# Patient Record
Sex: Female | Born: 1965 | Race: White | Hispanic: No | Smoking: Current every day smoker
Health system: Southern US, Community
[De-identification: ages and names within clinical notes are randomized; demographics above are authoritative.]

## PROBLEM LIST (undated history)

## (undated) DIAGNOSIS — N6310 Unspecified lump in the right breast, unspecified quadrant: Secondary | ICD-10-CM

## (undated) DIAGNOSIS — T8859XA Other complications of anesthesia, initial encounter: Secondary | ICD-10-CM

## (undated) DIAGNOSIS — M1990 Unspecified osteoarthritis, unspecified site: Secondary | ICD-10-CM

## (undated) DIAGNOSIS — T4145XA Adverse effect of unspecified anesthetic, initial encounter: Secondary | ICD-10-CM

## (undated) HISTORY — PX: ABDOMINAL HYSTERECTOMY: SHX81

## (undated) HISTORY — PX: THYROIDECTOMY, PARTIAL: SHX18

---

## 2010-09-12 ENCOUNTER — Encounter: Admission: RE | Admit: 2010-09-12 | Discharge: 2010-09-12 | Payer: Self-pay | Admitting: Obstetrics and Gynecology

## 2011-08-13 ENCOUNTER — Emergency Department (HOSPITAL_COMMUNITY)
Admission: EM | Admit: 2011-08-13 | Discharge: 2011-08-13 | Discharge status: Against Medical Advice | Payer: Self-pay | Attending: Emergency Medicine | Admitting: Emergency Medicine

## 2011-08-13 DIAGNOSIS — R079 Chest pain, unspecified: Secondary | ICD-10-CM | POA: Insufficient documentation

## 2012-07-26 ENCOUNTER — Ambulatory Visit: Payer: Self-pay

## 2012-12-17 ENCOUNTER — Other Ambulatory Visit: Payer: Self-pay | Admitting: Family Medicine

## 2012-12-17 DIAGNOSIS — Z1231 Encounter for screening mammogram for malignant neoplasm of breast: Secondary | ICD-10-CM

## 2013-01-12 ENCOUNTER — Ambulatory Visit
Admission: RE | Admit: 2013-01-12 | Discharge: 2013-01-12 | Disposition: A | Discharge status: Home / Self Care | Payer: BC Managed Care – PPO | Source: Ambulatory Visit | Attending: Family Medicine | Admitting: Family Medicine

## 2013-01-12 DIAGNOSIS — Z1231 Encounter for screening mammogram for malignant neoplasm of breast: Secondary | ICD-10-CM

## 2013-11-29 IMAGING — CR DG SHOULDER 3+V*R*
1 series · 3 of 3 positions shown · non-contrast
Comparison: none

REASON FOR EXAM: shoulder pain and swelling
COMMENTS:   LMP: Post Hysterectomy

PROCEDURE:     MDR - MDR SHOULDER RIGHT COMPLETE  - July 26, 2012  [DATE]
RESULT:     Comparison: None.

[Series 1: internal rotate · 0.17mm/px · 3 of 3 slices shown]
[im 1/3]
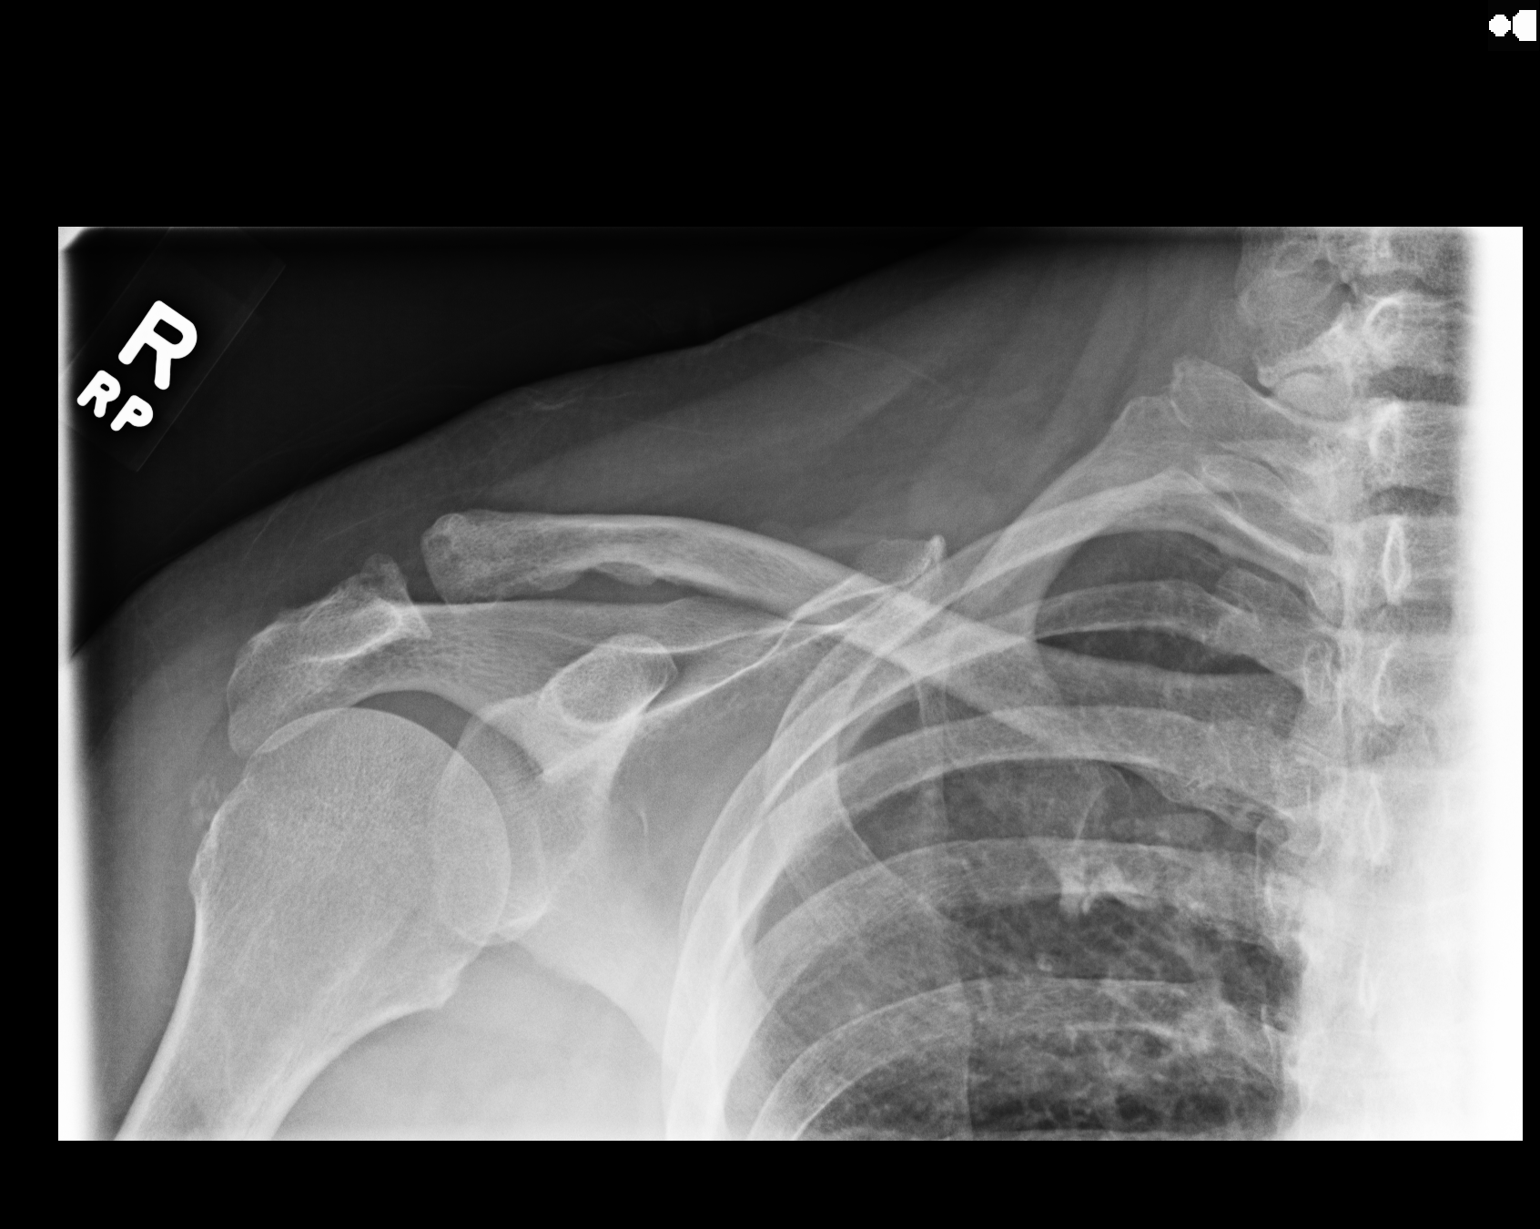
[im 2/3]
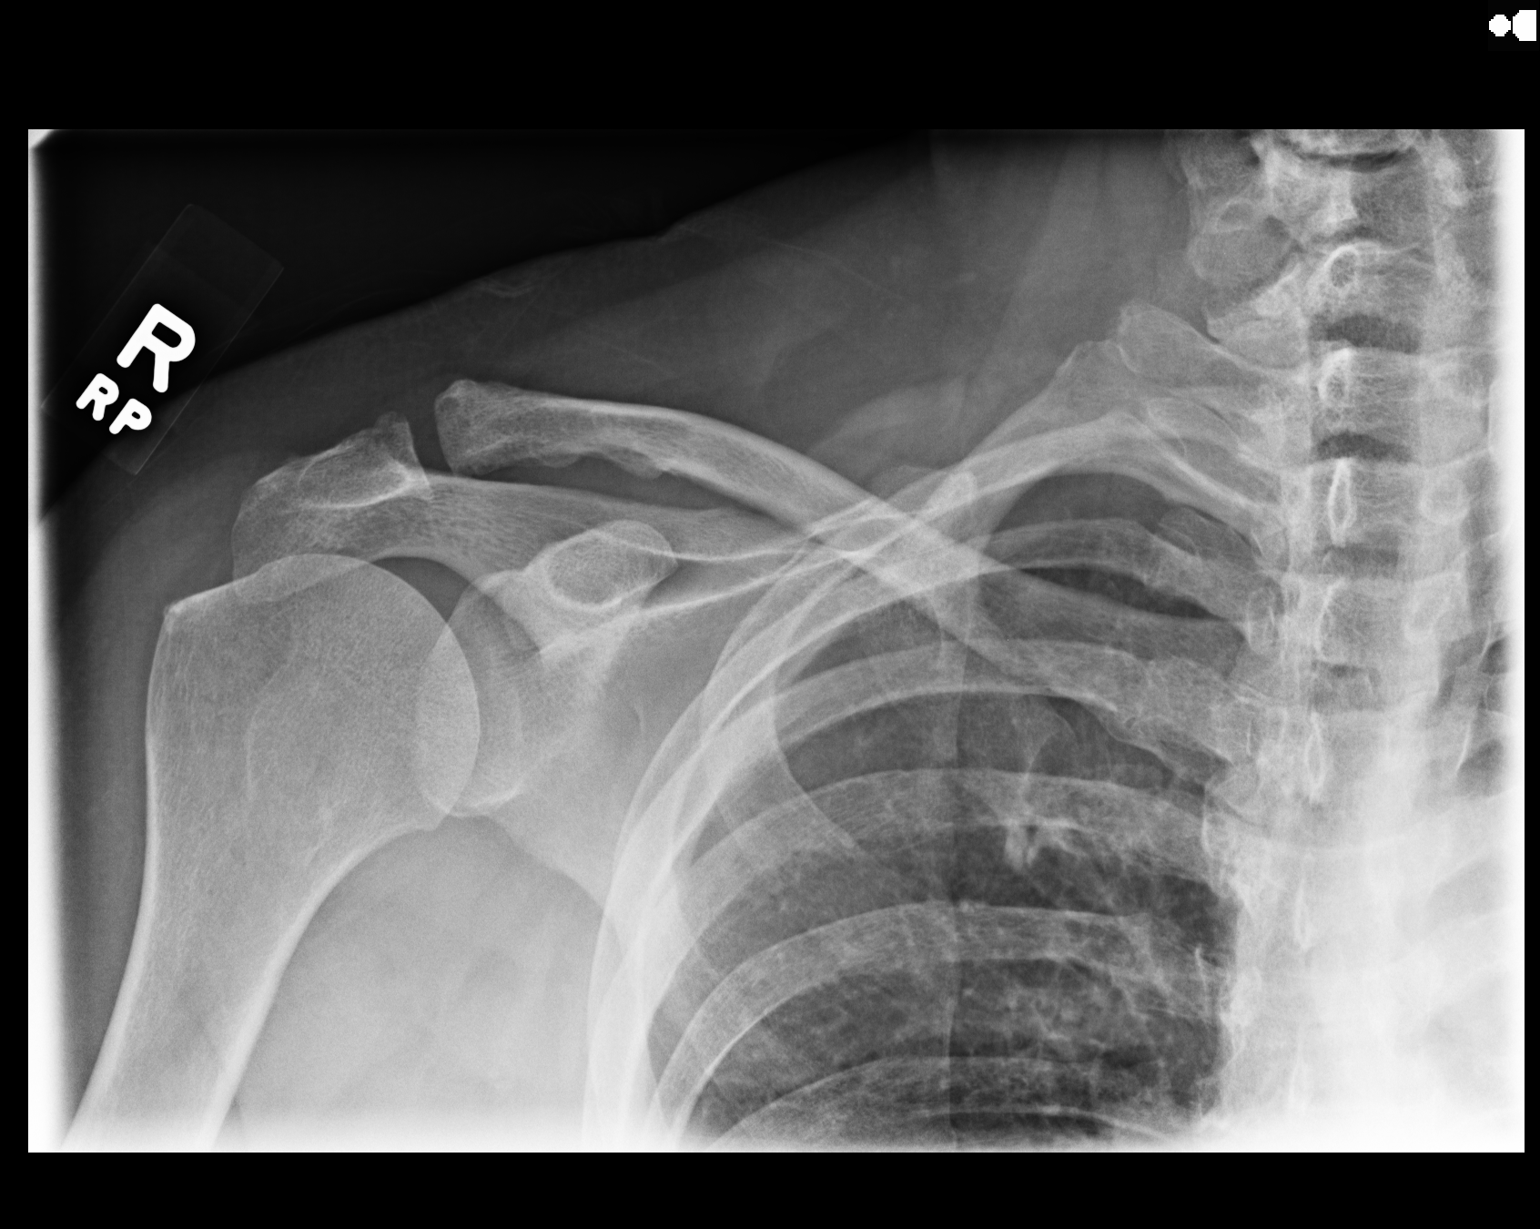
[im 3/3]
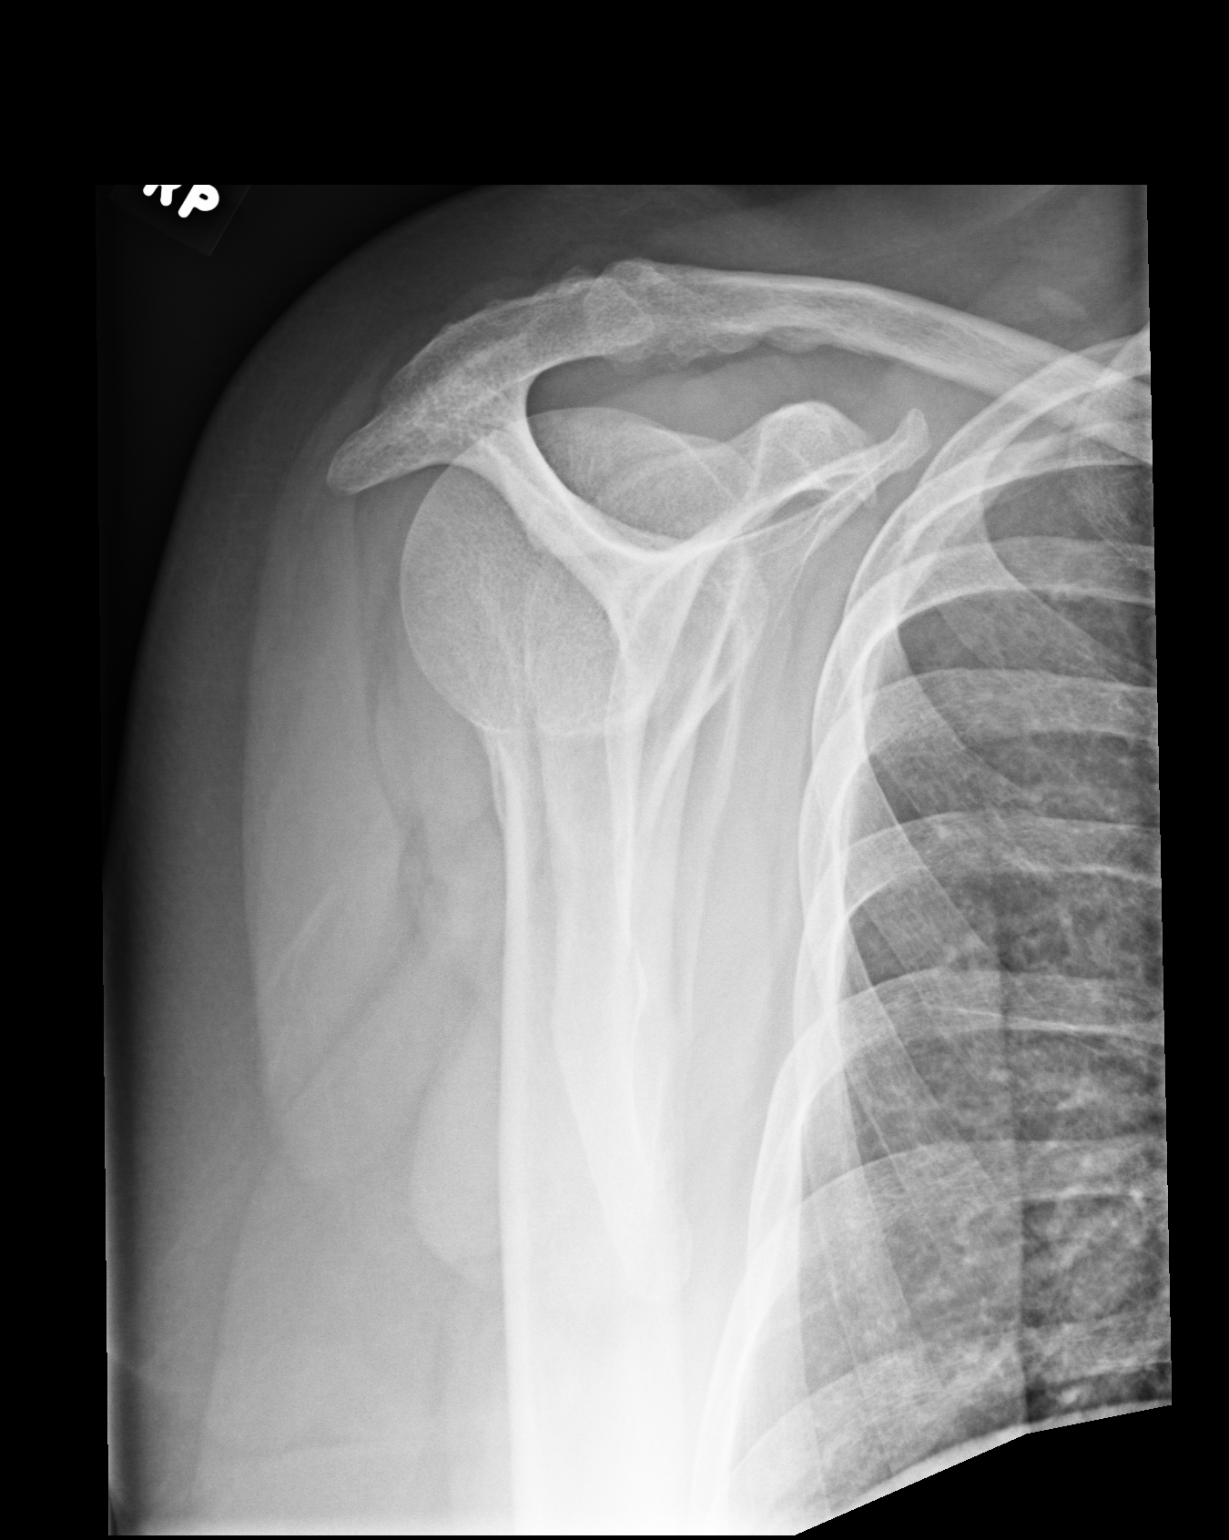

[3 of 3 positions shown; findings below may reference images not displayed]

FINDINGS: There is minimal degenerative change of the acromioclavicular joint. No
acute fracture or dislocation. Small calcific density on the posterior
lateral aspect of humeral head may be related to calcific tendinosis of the
infraspinatus.
IMPRESSION: Findings which raise the possibility of calcific tendinosis.

[REDACTED]

## 2013-12-13 ENCOUNTER — Other Ambulatory Visit: Payer: Self-pay

## 2013-12-13 DIAGNOSIS — Z1231 Encounter for screening mammogram for malignant neoplasm of breast: Secondary | ICD-10-CM

## 2014-01-16 ENCOUNTER — Ambulatory Visit
Admission: RE | Admit: 2014-01-16 | Discharge: 2014-01-16 | Disposition: A | Discharge status: Home / Self Care | Payer: BC Managed Care – PPO | Source: Ambulatory Visit

## 2014-01-16 DIAGNOSIS — Z1231 Encounter for screening mammogram for malignant neoplasm of breast: Secondary | ICD-10-CM

## 2014-01-17 ENCOUNTER — Other Ambulatory Visit: Payer: Self-pay | Admitting: Nurse Practitioner

## 2014-01-17 DIAGNOSIS — R928 Other abnormal and inconclusive findings on diagnostic imaging of breast: Secondary | ICD-10-CM

## 2014-01-30 ENCOUNTER — Ambulatory Visit
Admission: RE | Admit: 2014-01-30 | Discharge: 2014-01-30 | Disposition: A | Discharge status: Home / Self Care | Payer: BC Managed Care – PPO | Source: Ambulatory Visit | Attending: Nurse Practitioner | Admitting: Nurse Practitioner

## 2014-01-30 ENCOUNTER — Other Ambulatory Visit: Payer: Self-pay | Admitting: Nurse Practitioner

## 2014-01-30 DIAGNOSIS — R928 Other abnormal and inconclusive findings on diagnostic imaging of breast: Secondary | ICD-10-CM

## 2014-02-08 ENCOUNTER — Inpatient Hospital Stay: Admission: RE | Admit: 2014-02-08 | Payer: BC Managed Care – PPO | Source: Ambulatory Visit

## 2014-02-14 ENCOUNTER — Ambulatory Visit
Admission: RE | Admit: 2014-02-14 | Discharge: 2014-02-14 | Disposition: A | Discharge status: Home / Self Care | Payer: BC Managed Care – PPO | Source: Ambulatory Visit | Attending: Nurse Practitioner | Admitting: Nurse Practitioner

## 2014-02-14 ENCOUNTER — Other Ambulatory Visit: Payer: Self-pay | Admitting: Nurse Practitioner

## 2014-02-14 DIAGNOSIS — R928 Other abnormal and inconclusive findings on diagnostic imaging of breast: Secondary | ICD-10-CM

## 2014-07-04 ENCOUNTER — Other Ambulatory Visit: Payer: Self-pay | Admitting: Nurse Practitioner

## 2014-07-04 DIAGNOSIS — N6452 Nipple discharge: Secondary | ICD-10-CM

## 2014-07-13 ENCOUNTER — Ambulatory Visit
Admission: RE | Admit: 2014-07-13 | Discharge: 2014-07-13 | Disposition: A | Discharge status: Home / Self Care | Payer: BC Managed Care – PPO | Source: Ambulatory Visit | Attending: Nurse Practitioner | Admitting: Nurse Practitioner

## 2014-07-13 ENCOUNTER — Encounter (INDEPENDENT_AMBULATORY_CARE_PROVIDER_SITE_OTHER): Payer: Self-pay

## 2014-07-13 DIAGNOSIS — N6452 Nipple discharge: Secondary | ICD-10-CM

## 2014-11-10 HISTORY — PX: GASTRIC BYPASS: SHX52

## 2014-12-14 ENCOUNTER — Other Ambulatory Visit: Payer: Self-pay | Admitting: Nurse Practitioner

## 2014-12-14 DIAGNOSIS — N6452 Nipple discharge: Secondary | ICD-10-CM

## 2014-12-21 ENCOUNTER — Ambulatory Visit
Admission: RE | Admit: 2014-12-21 | Discharge: 2014-12-21 | Disposition: A | Discharge status: Home / Self Care | Payer: BLUE CROSS/BLUE SHIELD | Source: Ambulatory Visit | Attending: Nurse Practitioner | Admitting: Nurse Practitioner

## 2014-12-21 DIAGNOSIS — N6452 Nipple discharge: Secondary | ICD-10-CM

## 2016-09-09 ENCOUNTER — Other Ambulatory Visit: Payer: Self-pay | Admitting: Family Medicine

## 2016-09-09 DIAGNOSIS — Z1231 Encounter for screening mammogram for malignant neoplasm of breast: Secondary | ICD-10-CM

## 2016-09-22 ENCOUNTER — Ambulatory Visit
Admission: RE | Admit: 2016-09-22 | Discharge: 2016-09-22 | Disposition: A | Discharge status: Home / Self Care | Payer: BLUE CROSS/BLUE SHIELD | Source: Ambulatory Visit | Attending: Family Medicine | Admitting: Family Medicine

## 2016-09-22 DIAGNOSIS — Z1231 Encounter for screening mammogram for malignant neoplasm of breast: Secondary | ICD-10-CM

## 2016-09-25 ENCOUNTER — Other Ambulatory Visit: Payer: Self-pay | Admitting: Family Medicine

## 2016-09-25 DIAGNOSIS — R928 Other abnormal and inconclusive findings on diagnostic imaging of breast: Secondary | ICD-10-CM

## 2016-10-01 ENCOUNTER — Ambulatory Visit
Admission: RE | Admit: 2016-10-01 | Discharge: 2016-10-01 | Disposition: A | Discharge status: Home / Self Care | Payer: BLUE CROSS/BLUE SHIELD | Source: Ambulatory Visit | Attending: Family Medicine | Admitting: Family Medicine

## 2016-10-01 ENCOUNTER — Other Ambulatory Visit: Payer: Self-pay | Admitting: Family Medicine

## 2016-10-01 ENCOUNTER — Other Ambulatory Visit: Payer: BLUE CROSS/BLUE SHIELD

## 2016-10-01 DIAGNOSIS — R928 Other abnormal and inconclusive findings on diagnostic imaging of breast: Secondary | ICD-10-CM

## 2016-10-01 DIAGNOSIS — N6310 Unspecified lump in the right breast, unspecified quadrant: Secondary | ICD-10-CM

## 2016-10-07 ENCOUNTER — Other Ambulatory Visit: Payer: Self-pay | Admitting: Family Medicine

## 2016-10-07 ENCOUNTER — Ambulatory Visit
Admission: RE | Admit: 2016-10-07 | Discharge: 2016-10-07 | Disposition: A | Discharge status: Home / Self Care | Payer: BLUE CROSS/BLUE SHIELD | Source: Ambulatory Visit | Attending: Family Medicine | Admitting: Family Medicine

## 2016-10-07 DIAGNOSIS — N6310 Unspecified lump in the right breast, unspecified quadrant: Secondary | ICD-10-CM

## 2016-10-13 ENCOUNTER — Ambulatory Visit: Payer: Self-pay | Admitting: General Surgery

## 2016-10-13 DIAGNOSIS — N6489 Other specified disorders of breast: Secondary | ICD-10-CM

## 2016-11-13 ENCOUNTER — Other Ambulatory Visit: Payer: Self-pay | Admitting: General Surgery

## 2016-11-13 DIAGNOSIS — N6489 Other specified disorders of breast: Secondary | ICD-10-CM

## 2016-12-15 ENCOUNTER — Encounter (HOSPITAL_BASED_OUTPATIENT_CLINIC_OR_DEPARTMENT_OTHER): Payer: Self-pay | Admitting: *Deleted

## 2016-12-18 ENCOUNTER — Ambulatory Visit
Admission: RE | Admit: 2016-12-18 | Discharge: 2016-12-18 | Disposition: A | Discharge status: Home / Self Care | Payer: 59 | Source: Ambulatory Visit | Attending: General Surgery | Admitting: General Surgery

## 2016-12-18 DIAGNOSIS — N6489 Other specified disorders of breast: Secondary | ICD-10-CM

## 2016-12-19 ENCOUNTER — Ambulatory Visit
Admission: RE | Admit: 2016-12-19 | Discharge: 2016-12-19 | Disposition: A | Discharge status: Home / Self Care | Payer: 59 | Source: Ambulatory Visit | Attending: General Surgery | Admitting: General Surgery

## 2016-12-19 ENCOUNTER — Encounter (HOSPITAL_BASED_OUTPATIENT_CLINIC_OR_DEPARTMENT_OTHER): Payer: Self-pay | Admitting: *Deleted

## 2016-12-19 ENCOUNTER — Ambulatory Visit (HOSPITAL_BASED_OUTPATIENT_CLINIC_OR_DEPARTMENT_OTHER): Payer: 59 | Admitting: Anesthesiology

## 2016-12-19 ENCOUNTER — Encounter (HOSPITAL_BASED_OUTPATIENT_CLINIC_OR_DEPARTMENT_OTHER)
Admission: RE | Disposition: A | Discharge status: Home / Self Care | Payer: Self-pay | Source: Ambulatory Visit | Attending: General Surgery

## 2016-12-19 ENCOUNTER — Ambulatory Visit (HOSPITAL_BASED_OUTPATIENT_CLINIC_OR_DEPARTMENT_OTHER)
Admission: RE | Admit: 2016-12-19 | Discharge: 2016-12-19 | Disposition: A | Discharge status: Home / Self Care | Payer: 59 | Source: Ambulatory Visit | Attending: General Surgery | Admitting: General Surgery

## 2016-12-19 DIAGNOSIS — N62 Hypertrophy of breast: Secondary | ICD-10-CM | POA: Insufficient documentation

## 2016-12-19 DIAGNOSIS — K219 Gastro-esophageal reflux disease without esophagitis: Secondary | ICD-10-CM | POA: Insufficient documentation

## 2016-12-19 DIAGNOSIS — F329 Major depressive disorder, single episode, unspecified: Secondary | ICD-10-CM | POA: Diagnosis not present

## 2016-12-19 DIAGNOSIS — N6091 Unspecified benign mammary dysplasia of right breast: Secondary | ICD-10-CM | POA: Diagnosis not present

## 2016-12-19 DIAGNOSIS — E119 Type 2 diabetes mellitus without complications: Secondary | ICD-10-CM | POA: Diagnosis not present

## 2016-12-19 DIAGNOSIS — F17200 Nicotine dependence, unspecified, uncomplicated: Secondary | ICD-10-CM | POA: Diagnosis not present

## 2016-12-19 DIAGNOSIS — N6021 Fibroadenosis of right breast: Secondary | ICD-10-CM | POA: Insufficient documentation

## 2016-12-19 DIAGNOSIS — Z79899 Other long term (current) drug therapy: Secondary | ICD-10-CM | POA: Insufficient documentation

## 2016-12-19 DIAGNOSIS — Z9884 Bariatric surgery status: Secondary | ICD-10-CM | POA: Diagnosis not present

## 2016-12-19 DIAGNOSIS — N6489 Other specified disorders of breast: Secondary | ICD-10-CM

## 2016-12-19 DIAGNOSIS — Z803 Family history of malignant neoplasm of breast: Secondary | ICD-10-CM | POA: Diagnosis not present

## 2016-12-19 HISTORY — DX: Unspecified osteoarthritis, unspecified site: M19.90

## 2016-12-19 HISTORY — DX: Other complications of anesthesia, initial encounter: T88.59XA

## 2016-12-19 HISTORY — DX: Unspecified lump in the right breast, unspecified quadrant: N63.10

## 2016-12-19 HISTORY — DX: Adverse effect of unspecified anesthetic, initial encounter: T41.45XA

## 2016-12-19 HISTORY — PX: BREAST LUMPECTOMY WITH RADIOACTIVE SEED LOCALIZATION: SHX6424

## 2016-12-19 SURGERY — BREAST LUMPECTOMY WITH RADIOACTIVE SEED LOCALIZATION
Anesthesia: General | Site: Breast | Laterality: Right

## 2016-12-19 MED ORDER — PROMETHAZINE HCL 25 MG/ML IJ SOLN: 6.2500 mg | INTRAMUSCULAR | Status: DC | PRN

## 2016-12-19 MED ORDER — CHLORHEXIDINE GLUCONATE CLOTH 2 % EX PADS: 6.0000 | MEDICATED_PAD | Freq: Once | CUTANEOUS | Status: DC

## 2016-12-19 MED ORDER — ONDANSETRON HCL 4 MG/2ML IJ SOLN
INTRAMUSCULAR | Status: AC
  Filled 2016-12-19: qty 2

## 2016-12-19 MED ORDER — EPHEDRINE SULFATE 50 MG/ML IJ SOLN
INTRAMUSCULAR | Status: DC | PRN
  Administered 2016-12-19: 10 mg via INTRAVENOUS

## 2016-12-19 MED ORDER — MIDAZOLAM HCL 2 MG/2ML IJ SOLN
1.0000 mg | INTRAMUSCULAR | Status: DC | PRN
  Administered 2016-12-19: 2 mg via INTRAVENOUS

## 2016-12-19 MED ORDER — CEFAZOLIN SODIUM-DEXTROSE 2-4 GM/100ML-% IV SOLN
INTRAVENOUS | Status: AC
  Filled 2016-12-19: qty 100

## 2016-12-19 MED ORDER — LIDOCAINE HCL (CARDIAC) 20 MG/ML IV SOLN
INTRAVENOUS | Status: DC | PRN
  Administered 2016-12-19: 80 mg via INTRAVENOUS

## 2016-12-19 MED ORDER — PROPOFOL 10 MG/ML IV BOLUS
INTRAVENOUS | Status: DC | PRN
  Administered 2016-12-19: 150 mg via INTRAVENOUS

## 2016-12-19 MED ORDER — CEFAZOLIN SODIUM-DEXTROSE 2-4 GM/100ML-% IV SOLN
2.0000 g | INTRAVENOUS | Status: AC
  Administered 2016-12-19: 2 g via INTRAVENOUS

## 2016-12-19 MED ORDER — SCOPOLAMINE 1 MG/3DAYS TD PT72: 1.0000 | MEDICATED_PATCH | Freq: Once | TRANSDERMAL | Status: DC | PRN

## 2016-12-19 MED ORDER — DEXAMETHASONE SODIUM PHOSPHATE 10 MG/ML IJ SOLN
INTRAMUSCULAR | Status: AC
  Filled 2016-12-19: qty 1

## 2016-12-19 MED ORDER — LIDOCAINE 2% (20 MG/ML) 5 ML SYRINGE
INTRAMUSCULAR | Status: AC
  Filled 2016-12-19: qty 5

## 2016-12-19 MED ORDER — HYDROCODONE-ACETAMINOPHEN 5-325 MG PO TABS: 1.0000 | ORAL_TABLET | ORAL | 0 refills | Status: AC | PRN

## 2016-12-19 MED ORDER — ONDANSETRON HCL 4 MG/2ML IJ SOLN
INTRAMUSCULAR | Status: DC | PRN
  Administered 2016-12-19: 4 mg via INTRAVENOUS

## 2016-12-19 MED ORDER — BUPIVACAINE HCL (PF) 0.25 % IJ SOLN
INTRAMUSCULAR | Status: AC
  Filled 2016-12-19: qty 30

## 2016-12-19 MED ORDER — FENTANYL CITRATE (PF) 100 MCG/2ML IJ SOLN: 25.0000 ug | INTRAMUSCULAR | Status: DC | PRN

## 2016-12-19 MED ORDER — DEXAMETHASONE SODIUM PHOSPHATE 4 MG/ML IJ SOLN
INTRAMUSCULAR | Status: DC | PRN
  Administered 2016-12-19: 10 mg via INTRAVENOUS

## 2016-12-19 MED ORDER — BUPIVACAINE-EPINEPHRINE (PF) 0.5% -1:200000 IJ SOLN
INTRAMUSCULAR | Status: DC | PRN
  Administered 2016-12-19: 20 mL

## 2016-12-19 MED ORDER — LACTATED RINGERS IV SOLN
INTRAVENOUS | Status: DC
  Administered 2016-12-19: 07:00:00 via INTRAVENOUS

## 2016-12-19 MED ORDER — EPHEDRINE 5 MG/ML INJ
INTRAVENOUS | Status: AC
  Filled 2016-12-19: qty 10

## 2016-12-19 MED ORDER — FENTANYL CITRATE (PF) 100 MCG/2ML IJ SOLN
50.0000 ug | INTRAMUSCULAR | Status: DC | PRN
  Administered 2016-12-19: 100 ug via INTRAVENOUS

## 2016-12-19 MED ORDER — FENTANYL CITRATE (PF) 100 MCG/2ML IJ SOLN
INTRAMUSCULAR | Status: AC
  Filled 2016-12-19: qty 2

## 2016-12-19 MED ORDER — MIDAZOLAM HCL 2 MG/2ML IJ SOLN
INTRAMUSCULAR | Status: AC
  Filled 2016-12-19: qty 2

## 2016-12-19 MED ORDER — BUPIVACAINE-EPINEPHRINE (PF) 0.5% -1:200000 IJ SOLN
INTRAMUSCULAR | Status: AC
  Filled 2016-12-19: qty 30

## 2016-12-19 SURGICAL SUPPLY — 40 items
APPLIER CLIP 9.375 MED OPEN (MISCELLANEOUS)
BLADE SURG 15 STRL LF DISP TIS (BLADE) ×1 IMPLANT
BLADE SURG 15 STRL SS (BLADE) ×1
CANISTER SUC SOCK COL 7IN (MISCELLANEOUS) IMPLANT
CANISTER SUCT 1200ML W/VALVE (MISCELLANEOUS) ×2 IMPLANT
CHLORAPREP W/TINT 26ML (MISCELLANEOUS) ×2 IMPLANT
CLIP APPLIE 9.375 MED OPEN (MISCELLANEOUS) IMPLANT
COVER BACK TABLE 60X90IN (DRAPES) ×2 IMPLANT
COVER MAYO STAND STRL (DRAPES) ×2 IMPLANT
COVER PROBE W GEL 5X96 (DRAPES) ×2 IMPLANT
DECANTER SPIKE VIAL GLASS SM (MISCELLANEOUS) IMPLANT
DERMABOND ADVANCED (GAUZE/BANDAGES/DRESSINGS) ×1
DERMABOND ADVANCED .7 DNX12 (GAUZE/BANDAGES/DRESSINGS) ×1 IMPLANT
DEVICE DUBIN W/COMP PLATE 8390 (MISCELLANEOUS) ×2 IMPLANT
DRAPE LAPAROSCOPIC ABDOMINAL (DRAPES) ×2 IMPLANT
DRAPE UTILITY XL STRL (DRAPES) ×2 IMPLANT
ELECT COATED BLADE 2.86 ST (ELECTRODE) ×2 IMPLANT
ELECT REM PT RETURN 9FT ADLT (ELECTROSURGICAL) ×2
ELECTRODE REM PT RTRN 9FT ADLT (ELECTROSURGICAL) ×1 IMPLANT
GLOVE BIO SURGEON STRL SZ 6.5 (GLOVE) ×4 IMPLANT
GLOVE BIO SURGEON STRL SZ7.5 (GLOVE) ×4 IMPLANT
GOWN STRL REUS W/ TWL LRG LVL3 (GOWN DISPOSABLE) ×2 IMPLANT
GOWN STRL REUS W/TWL LRG LVL3 (GOWN DISPOSABLE) ×2
ILLUMINATOR WAVEGUIDE N/F (MISCELLANEOUS) IMPLANT
KIT MARKER MARGIN INK (KITS) ×2 IMPLANT
LIGHT WAVEGUIDE WIDE FLAT (MISCELLANEOUS) IMPLANT
NEEDLE HYPO 25X1 1.5 SAFETY (NEEDLE) IMPLANT
NS IRRIG 1000ML POUR BTL (IV SOLUTION) IMPLANT
PACK BASIN DAY SURGERY FS (CUSTOM PROCEDURE TRAY) ×2 IMPLANT
PENCIL BUTTON HOLSTER BLD 10FT (ELECTRODE) ×2 IMPLANT
SLEEVE SCD COMPRESS KNEE MED (MISCELLANEOUS) ×2 IMPLANT
SPONGE LAP 18X18 X RAY DECT (DISPOSABLE) ×2 IMPLANT
SUT MON AB 4-0 PC3 18 (SUTURE) ×2 IMPLANT
SUT SILK 2 0 SH (SUTURE) IMPLANT
SUT VICRYL 3-0 CR8 SH (SUTURE) ×2 IMPLANT
SYR CONTROL 10ML LL (SYRINGE) IMPLANT
TOWEL OR 17X24 6PK STRL BLUE (TOWEL DISPOSABLE) ×2 IMPLANT
TOWEL OR NON WOVEN STRL DISP B (DISPOSABLE) IMPLANT
TUBE CONNECTING 20X1/4 (TUBING) ×2 IMPLANT
YANKAUER SUCT BULB TIP NO VENT (SUCTIONS) IMPLANT

## 2016-12-19 NOTE — H&P (Signed)
Sheena Hall  Location: [facility] Patient #: [id number] DOB: [birth year] Divorced / Language: [place] / Race: White Female   History of Present Illness  The patient is a 51 year old female who presents with a breast mass. We are asked to see the patient in consultation by Dr. Norva Pavlov to evaluate her for right nipple discharge. The patient is a 51 year old white female who has been experiencing right nipple discharge spontaneously for the last couple years. She states that it occurs several times a week. The color has been the either Ursa or clear yellow. She denies any breast pain. She does have a family history of breast cancer in her maternal and paternal grandmothers. She underwent mammogram and ultrasound which showed a 1.5 cm area in the 2:30 position of the right breast beneath the areolar as well as an area of thickening in the 4 to 5 o'clock position of the right breast. 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 multiple Gastric Bypass  Hysterectomy (not due to cancer) - Partial  Thyroid Surgery  Tonsillectomy   Diagnostic Studies History  Colonoscopy  1-5 years ago Mammogram  within last year Pap Smear  >5 years ago  Allergies No Known Drug Allergies   Medication History  Lexapro (10MG  Tablet, Oral daily) Active. PriLOSEC (40MG  Capsule DR, Oral daily) Active. B12 Folate (800-800MCG Capsule, Oral daily) Active. Multi-Minerals (Oral two times daily) Active. Vitamin D3 (1000UNIT Tablet, Oral daily) Active. Medications Reconciled  Social History Alcohol use  Occasional alcohol use. Caffeine use  Coffee. No drug use  Tobacco use  Current some day  smoker.  Family History Arthritis  Father, Mother. Diabetes Mellitus  Father. Hypertension  Mother, Son. Respiratory Condition  Father.  Pregnancy / Birth History  Age at menarche  9 years. Gravida  2 Maternal age  35-20 Para  2    Review of Systems General Not Present- Appetite Loss, Chills, Fatigue, Fever, Night Sweats, Weight Gain and Weight Loss. Skin Not Present- Change in Wart/Mole, Dryness, Hives, Jaundice, New Lesions, Non-Healing Wounds, Rash and Ulcer. HEENT Present- Seasonal Allergies and Wears glasses/contact lenses. Not Present- Earache, Hearing Loss, Hoarseness, Nose Bleed, Oral Ulcers, Ringing in the Ears, Sinus Pain, Sore Throat, Visual Disturbances and Yellow Eyes. Respiratory Not Present- Bloody sputum, Chronic Cough, Difficulty Breathing, Snoring and Wheezing. Breast Present- Breast Mass and Nipple Discharge. Not Present- Breast Pain and Skin Changes. Cardiovascular Not Present- Chest Pain, Difficulty Breathing Lying Down, Leg Cramps, Palpitations, Rapid Heart Rate, Shortness of Breath and Swelling of Extremities. Gastrointestinal Not Present- Abdominal Pain, Bloating, Bloody Stool, Change in Bowel Habits, Chronic diarrhea, Constipation, Difficulty Swallowing, Excessive gas, Gets full quickly at meals, Hemorrhoids, Indigestion, Nausea, Rectal Pain and Vomiting. Female Genitourinary Not Present- Frequency, Nocturia, Painful Urination, Pelvic Pain and Urgency. Musculoskeletal Not Present- Back Pain, Joint Pain, Joint Stiffness, Muscle Pain, Muscle Weakness and Swelling of Extremities. Neurological Not Present- Decreased Memory, Fainting, Headaches, Numbness, Seizures, Tingling, Tremor, Trouble walking and Weakness. Psychiatric Not Present- Anxiety, Bipolar, Change in Sleep Pattern, Depression, Fearful and Frequent crying. Endocrine Not Present- Cold Intolerance, Excessive Hunger, Hair Changes, Heat Intolerance, Hot flashes and New Diabetes. Hematology Not  Present- Blood Thinners, Easy Bruising, Excessive bleeding, Gland problems, HIV and Persistent Infections.  Vitals Weight: 169.6  lb Height: 69in Body Surface Area: 1.93 m Body Mass Index: 25.05 kg/m  Temp.: 98.61F  Pulse: 92 (Regular)  BP: 118/78 (Sitting, Left Arm, Standard)       Physical Exam  General Mental Status-Alert. General Appearance-Consistent with stated age. Hydration-Well hydrated. Voice-Normal.  Head and Neck Head-normocephalic, atraumatic with no lesions or palpable masses. Trachea-midline. Thyroid Gland Characteristics - normal size and consistency.  Eye Eyeball - Bilateral-Extraocular movements intact. Sclera/Conjunctiva - Bilateral-No scleral icterus.  Chest and Lung Exam Chest and lung exam reveals -quiet, even and easy respiratory effort with no use of accessory muscles and on auscultation, normal breath sounds, no adventitious sounds and normal vocal resonance. Inspection Chest Wall - Normal. Back - normal.  Breast Note: There is some bruising in the inner aspect of the right breast. There is no palpable mass in either breast. There is a small mobile palpable lymph node in the left axilla. There is no other palpable axillary, supraclavicular, or cervical lymphadenopathy.   Cardiovascular Cardiovascular examination reveals -normal heart sounds, regular rate and rhythm with no murmurs and normal pedal pulses bilaterally.  Abdomen Inspection Inspection of the abdomen reveals - No Hernias. Skin - Scar - no surgical scars. Palpation/Percussion Palpation and Percussion of the abdomen reveal - Soft, Non Tender, No Rebound tenderness, No Rigidity (guarding) and No hepatosplenomegaly. Auscultation Auscultation of the abdomen reveals - Bowel sounds normal.  Neurologic Neurologic evaluation reveals -alert and oriented x 3 with no impairment of recent or remote memory. Mental Status-Normal.  Musculoskeletal Normal  Exam - Left-Upper Extremity Strength Normal and Lower Extremity Strength Normal. Normal Exam - Right-Upper Extremity Strength Normal and Lower Extremity Strength Normal.  Lymphatic Head & Neck  General Head & Neck Lymphatics: Bilateral - Description - Normal. Axillary  General Axillary Region: Bilateral - Description - Normal. Tenderness - Non Tender. Femoral & Inguinal  Generalized Femoral & Inguinal Lymphatics: Bilateral - Description - Normal. Tenderness - Non Tender.    Assessment & Plan  PSEUDOANGIOMATOUS STROMAL HYPERPLASIA OF BREAST (N62) Impression: The patient appears to have 2 areas in the right breast of pseudo-angiomatous stromal hyperplasia. Because of their appearance and because they can be considered a high risk lesion I would recommend that each of these areas be removed. She would also like to have this done. I have discussed with her in detail the risks and benefits of the operation to do this as well as some of the technical aspects and she understands and wishes to proceed. I will plan for a right breast radioactive seed localized lumpectomy 2 Current Plans Pt Education - Breast Diseases: discussed with patient and provided information.

## 2016-12-19 NOTE — Transfer of Care (Signed)
Immediate Anesthesia Transfer of Care Note  Patient: Marzella Schleinngela M Luz  Procedure(s) Performed: Procedure(s): RIGHT BREAST LUMPECTOMY WITH RADIOACTIVE SEED LOCALIZATION X2 (Right)  Patient Location: PACU  Anesthesia Type:General  Level of Consciousness: awake and oriented  Airway & Oxygen Therapy: Patient Spontanous Breathing and Patient connected to face mask oxygen  Post-op Assessment: Report given to RN and Post -op Vital signs reviewed and stable  Post vital signs: Reviewed and stable  Last Vitals:  Vitals:   12/19/16 0635  BP: 112/76  Pulse: 86  Resp: 18  Temp: 36.9 C    Last Pain:  Vitals:   12/19/16 0635  TempSrc: Oral      Patients Stated Pain Goal: 0 (12/19/16 [id number])  Complications: No apparent anesthesia complications

## 2016-12-19 NOTE — Anesthesia Procedure Notes (Signed)
Procedure Name: LMA Insertion Performed by: Lula Kolton, [place] Pre-anesthesia Checklist: Patient identified, Emergency Drugs available, Suction available and Patient being monitored Patient Re-evaluated:Patient Re-evaluated prior to inductionOxygen Delivery Method: Circle system utilized Preoxygenation: Pre-oxygenation with 100% oxygen Intubation Type: IV induction Ventilation: Mask ventilation without difficulty LMA: LMA inserted LMA Size: 4.0 Number of attempts: 1 Airway Equipment and Method: Bite block Placement Confirmation: positive ETCO2 Tube secured with: Tape Dental Injury: Teeth and Oropharynx as per pre-operative assessment        

## 2016-12-19 NOTE — Interval H&P Note (Signed)
History and Physical Interval Note:  12/19/2016 7:19 AM  Sheena Hall  has presented today for surgery, with the diagnosis of RIGHT BREAST PASH  The various methods of treatment have been discussed with the patient and family. After consideration of risks, benefits and other options for treatment, the patient has consented to  Procedure(s): RIGHT BREAST LUMPECTOMY WITH RADIOACTIVE SEED LOCALIZATION X2 (Right) as a surgical intervention .  The patient's history has been reviewed, patient examined, no change in status, stable for surgery.  I have reviewed the patient's chart and labs.  Questions were answered to the patient's satisfaction.     TOTH III,PAUL S

## 2016-12-19 NOTE — Discharge Instructions (Signed)

## 2016-12-19 NOTE — Op Note (Signed)
12/19/2016  8:35 AM  PATIENT:  Sheena SchleinAngela M Hall  51 y.o. female  PRE-OPERATIVE DIAGNOSIS:  RIGHT BREAST PASH  POST-OPERATIVE DIAGNOSIS:  RIGHT BREAST PASH  PROCEDURE:  Procedure(s): RIGHT BREAST LUMPECTOMY WITH RADIOACTIVE SEED LOCALIZATION X2 (Right)  SURGEON:  Surgeon(s) and Role:    * Griselda MinerPaul Toth III, MD - Primary  PHYSICIAN ASSISTANT:   ASSISTANTS: none   ANESTHESIA:   local and general  EBL:  Total I/O In: 1000 [I.V.:1000] Out: -   BLOOD ADMINISTERED:none  DRAINS: none   LOCAL MEDICATIONS USED:  MARCAINE     SPECIMEN:  Source of Specimen:  right breast tissue  DISPOSITION OF SPECIMEN:  PATHOLOGY  COUNTS:  YES  TOURNIQUET:  * No tourniquets in log *  DICTATION: .Dragon Dictation   After informed consent was obtained the patient was brought to the operating room and placed in the supine position on the operating table. After adequate induction of general anesthesia the patient's right breast was prepped with ChloraPrep, allowed to dry, and draped in usual sterile manner. An appropriate timeout was performed. Previously 2 I-125 seeds were placed in the central inner right breast to mark 2 areas of pseudo-angiomatous stromal hyperplasia. The neoprobe was set to I-125 in the area of radioactivity was readily identified. A curvilinear incision was made along the inner aspect of the edge of the areola with a 15 blade knife. The incision was carried through the skin and subcutaneous tissue sharply with electrocautery. Using the neoprobe to check the area of radioactivity frequently I was able to remove 2 circular areas of breast tissue around each radioactive seed. Once the 2 specimens were removed they were oriented with the appropriate paint colors. A specimen radiograph was obtained that showed the clip and the seeds to be in the center of each specimen. The specimens were then sent to pathology for further evaluation. Hemostasis was achieved using the Bovie electrocautery. The  wound was infiltrated with quarter percent Marcaine. The wound was irrigated with copious amounts of saline. The deep layer of the wound was then closed with layers of interrupted 3-0 Vicryl stitches. The skin was then closed with interrupted 4-0 Monocryl subcuticular stitches. Dermabond dressings were applied. Patient tolerated the procedure well. At the end of the case all needle sponge and counts were correct. The patient was then awakened and taken to recovery in stable condition.  PLAN OF CARE: Discharge to home after PACU  PATIENT DISPOSITION:  PACU - hemodynamically stable.   Delay start of Pharmacological VTE agent (>24hrs) due to surgical blood loss or risk of bleeding: not applicable

## 2016-12-19 NOTE — Anesthesia Preprocedure Evaluation (Signed)
Anesthesia Evaluation  Patient identified by MRN, date of birth, ID band Patient awake    Reviewed: Allergy & Precautions, NPO status , Patient's Chart, lab work & pertinent test results  History of Anesthesia Complications (+) history of anesthetic complications ("My BP drops in PACU")  Airway Mallampati: II  TM Distance: >3 FB Neck ROM: Full    Dental  (+) Teeth Intact, Dental Advisory Given   Pulmonary Current Smoker,    Pulmonary exam normal breath sounds clear to auscultation       Cardiovascular negative cardio ROS Normal cardiovascular exam Rhythm:Regular Rate:Normal     Neuro/Psych negative neurological ROS     GI/Hepatic Neg liver ROS, H/o gastric bypass   Endo/Other  negative endocrine ROS  Renal/GU negative Renal ROS     Musculoskeletal  (+) Arthritis ,   Abdominal   Peds  Hematology negative hematology ROS (+)   Anesthesia Other Findings Day of surgery medications reviewed with the patient.  Right breast mass  Reproductive/Obstetrics                             Anesthesia Physical Anesthesia Plan  ASA: II  Anesthesia Plan: General   Post-op Pain Management:    Induction: Intravenous  Airway Management Planned: LMA  Additional Equipment:   Intra-op Plan:   Post-operative Plan: Extubation in OR  Informed Consent: I have reviewed the patients History and Physical, chart, labs and discussed the procedure including the risks, benefits and alternatives for the proposed anesthesia with the patient or authorized representative who has indicated his/her understanding and acceptance.   Dental advisory given  Plan Discussed with: CRNA  Anesthesia Plan Comments: (Risks/benefits of general anesthesia discussed with patient including risk of damage to teeth, lips, gum, and tongue, nausea/vomiting, allergic reactions to medications, and the possibility of heart attack,  stroke and death.  All patient questions answered.  Patient wishes to proceed.)        Anesthesia Quick Evaluation

## 2016-12-19 NOTE — Anesthesia Postprocedure Evaluation (Signed)
Anesthesia Post Note  Patient: Marzella Schleinngela M Outland  Procedure(s) Performed: Procedure(s) (LRB): RIGHT BREAST LUMPECTOMY WITH RADIOACTIVE SEED LOCALIZATION X2 (Right)  Patient location during evaluation: PACU Anesthesia Type: General Level of consciousness: awake and alert Pain management: pain level controlled Vital Signs Assessment: post-procedure vital signs reviewed and stable Respiratory status: spontaneous breathing, nonlabored ventilation, respiratory function stable and patient connected to nasal cannula oxygen Cardiovascular status: blood pressure returned to baseline and stable Postop Assessment: no signs of nausea or vomiting Anesthetic complications: no       Last Vitals:  Vitals:   12/19/16 0906 12/19/16 0925  BP:  (!) 105/53  Pulse: 76 68  Resp: 16 18  Temp:  36.5 C    Last Pain:  Vitals:   12/19/16 0900  TempSrc:   PainSc: 2                  Cecile HearingStephen Edward Jennice Renegar

## 2016-12-22 ENCOUNTER — Encounter (HOSPITAL_BASED_OUTPATIENT_CLINIC_OR_DEPARTMENT_OTHER): Payer: Self-pay | Admitting: General Surgery
